# Patient Record
Sex: Male | Born: 1985 | Hispanic: Yes | Marital: Married | Smoking: Never smoker
Health system: Southern US, Community
[De-identification: ages and names within clinical notes are randomized; demographics above are authoritative.]

---

## 2022-04-28 ENCOUNTER — Other Ambulatory Visit: Payer: Self-pay

## 2022-04-28 ENCOUNTER — Encounter (HOSPITAL_COMMUNITY): Payer: Self-pay

## 2022-04-28 ENCOUNTER — Emergency Department (HOSPITAL_COMMUNITY): Payer: Self-pay

## 2022-04-28 ENCOUNTER — Observation Stay (HOSPITAL_COMMUNITY)
Admission: EM | Admit: 2022-04-28 | Discharge: 2022-04-30 | Disposition: A | Discharge status: Home / Self Care | Payer: Self-pay | Attending: Student | Admitting: Student

## 2022-04-28 DIAGNOSIS — S82232A Displaced oblique fracture of shaft of left tibia, initial encounter for closed fracture: Secondary | ICD-10-CM

## 2022-04-28 DIAGNOSIS — S82232G Displaced oblique fracture of shaft of left tibia, subsequent encounter for closed fracture with delayed healing: Principal | ICD-10-CM | POA: Insufficient documentation

## 2022-04-28 DIAGNOSIS — S82202A Unspecified fracture of shaft of left tibia, initial encounter for closed fracture: Secondary | ICD-10-CM | POA: Diagnosis present

## 2022-04-28 DIAGNOSIS — S82432G Displaced oblique fracture of shaft of left fibula, subsequent encounter for closed fracture with delayed healing: Secondary | ICD-10-CM | POA: Insufficient documentation

## 2022-04-28 DIAGNOSIS — W1840XD Slipping, tripping and stumbling without falling, unspecified, subsequent encounter: Secondary | ICD-10-CM | POA: Insufficient documentation

## 2022-04-28 LAB — CBC WITH DIFFERENTIAL/PLATELET
Abs Immature Granulocytes: 0.03 10*3/uL (ref 0.00–0.07)
Basophils Absolute: 0 10*3/uL (ref 0.0–0.1)
Basophils Relative: 0 %
Eosinophils Absolute: 0.1 10*3/uL (ref 0.0–0.5)
Eosinophils Relative: 1 %
HCT: 42.8 % (ref 39.0–52.0)
Hemoglobin: 14.6 g/dL (ref 13.0–17.0)
Immature Granulocytes: 0 %
Lymphocytes Relative: 20 %
Lymphs Abs: 2 10*3/uL (ref 0.7–4.0)
MCH: 29 pg (ref 26.0–34.0)
MCHC: 34.1 g/dL (ref 30.0–36.0)
MCV: 84.9 fL (ref 80.0–100.0)
Monocytes Absolute: 0.8 10*3/uL (ref 0.1–1.0)
Monocytes Relative: 9 %
Neutro Abs: 6.8 10*3/uL (ref 1.7–7.7)
Neutrophils Relative %: 70 %
Platelets: 292 10*3/uL (ref 150–400)
RBC: 5.04 MIL/uL (ref 4.22–5.81)
RDW: 13.3 % (ref 11.5–15.5)
WBC: 9.7 10*3/uL (ref 4.0–10.5)
nRBC: 0 % (ref 0.0–0.2)

## 2022-04-28 LAB — BASIC METABOLIC PANEL
Anion gap: 9 (ref 5–15)
BUN: 14 mg/dL (ref 6–20)
CO2: 23 mmol/L (ref 22–32)
Calcium: 9.1 mg/dL (ref 8.9–10.3)
Chloride: 107 mmol/L (ref 98–111)
Creatinine, Ser: 0.88 mg/dL (ref 0.61–1.24)
GFR, Estimated: >60 mL/min (ref >60)
Glucose, Bld: 116 mg/dL — ABNORMAL HIGH (ref 70–99)
Potassium: 3.4 mmol/L — ABNORMAL LOW (ref 3.5–5.1)
Sodium: 139 mmol/L (ref 135–145)

## 2022-04-28 MED ORDER — METHOCARBAMOL 500 MG PO TABS
500.0000 mg | ORAL_TABLET | Freq: Four times a day (QID) | ORAL | Status: DC | PRN
  Administered 2022-04-29 – 2022-04-30 (×3): 500 mg via ORAL
  Filled 2022-04-28 (×3): qty 1

## 2022-04-28 MED ORDER — POLYETHYLENE GLYCOL 3350 17 G PO PACK: 17.0000 g | PACK | Freq: Every day | ORAL | Status: DC | PRN

## 2022-04-28 MED ORDER — ACETAMINOPHEN 325 MG PO TABS: 325.0000 mg | ORAL_TABLET | Freq: Four times a day (QID) | ORAL | Status: DC | PRN

## 2022-04-28 MED ORDER — ENOXAPARIN SODIUM 40 MG/0.4ML IJ SOSY: 40.0000 mg | PREFILLED_SYRINGE | INTRAMUSCULAR | Status: DC

## 2022-04-28 MED ORDER — ONDANSETRON HCL 4 MG/2ML IJ SOLN
4.0000 mg | Freq: Four times a day (QID) | INTRAMUSCULAR | Status: DC | PRN
  Administered 2022-04-29: 4 mg via INTRAVENOUS
  Filled 2022-04-28: qty 2

## 2022-04-28 MED ORDER — HYDROCODONE-ACETAMINOPHEN 5-325 MG PO TABS
1.0000 | ORAL_TABLET | ORAL | Status: DC | PRN
  Administered 2022-04-29 (×2): 1 via ORAL
  Administered 2022-04-29 – 2022-04-30 (×2): 2 via ORAL
  Filled 2022-04-28 (×3): qty 2
  Filled 2022-04-28: qty 1

## 2022-04-28 MED ORDER — ONDANSETRON HCL 4 MG PO TABS: 4.0000 mg | ORAL_TABLET | Freq: Four times a day (QID) | ORAL | Status: DC | PRN

## 2022-04-28 MED ORDER — METHOCARBAMOL 1000 MG/10ML IJ SOLN: 500.0000 mg | Freq: Four times a day (QID) | INTRAVENOUS | Status: DC | PRN

## 2022-04-28 MED ORDER — MORPHINE SULFATE (PF) 2 MG/ML IV SOLN
0.5000 mg | INTRAVENOUS | Status: DC | PRN
  Administered 2022-04-29 (×2): 1 mg via INTRAVENOUS
  Filled 2022-04-28 (×2): qty 1

## 2022-04-28 NOTE — ED Triage Notes (Signed)
Pt arrived POV c/o a left foot fx. Pt's family said she just drove him down from [place] because he broke his foot and they stated he might need surgery.

## 2022-04-28 NOTE — ED Provider Triage Note (Signed)
Emergency Medicine Provider Triage Evaluation Note  Ross Bennett , a 36 y.o. male  was evaluated in triage.  Pt complains of a positive left lower leg fracture.  This occurred yesterday up in [place].  He was splinted in the emergency department up there and told to come back to his residence.  Patient's significant other is at bedside stated that he needed surgery.  No other injury at this time.  Review of Systems  Positive:  Negative: See above  Physical Exam  BP 133/75   Pulse 75   Temp 97.9 F (36.6 C) (Oral)   Resp 16   SpO2 100%  Gen:   Awake, no distress   Resp:  Normal effort  MSK:   Moves extremities without difficulty  Other:  Left lower extremity is wrapped.  Medical Decision Making  Medically screening exam initiated at 3:44 PM.  Appropriate orders placed.  Ross Bennett was informed that the remainder of the evaluation will be completed by another provider, this initial triage assessment does not replace that evaluation, and the importance of remaining in the ED until their evaluation is complete.     Honor Loh [place], [place] 04/28/22 1545

## 2022-04-28 NOTE — ED Provider Notes (Signed)
[facility] Provider Note   CSN: [id number] Arrival date & time: 04/28/22  1529     History  Chief Complaint  Patient presents with   Foot Pain    Ross Bennett is a 36 y.o. male with no pertinent past medical history.  Presents emergency department with a chief complaint of left lower extremity fracture.  Patient states that yesterday while in [place] he was playing soccer when he slipped and tried to turn.  Patient states that he heard a crack.  Patient has had pain to left lower leg, ankle, and foot since then.  Patient went to emergency department and was diagnosed with a fracture and placed in a soft splint.  Patient reports that he was told to go home and follow-up with provider there.  Patient states he was sent home with pain medication.  Patient reports that he does not have any [place]opedic providers [place] [place].  Patient rates pain 4/10 at present.  States that with movement pain increases to 9/10 on the pain scale.    Patient denies any numbness, weakness, color change, pallor, wound.   Foot Pain      Home Medications Prior to Admission medications   Not on File      Allergies    Patient has no known allergies.    Review of Systems   Review of Systems  Constitutional:  Negative for chills and fever.  Musculoskeletal:  Positive for arthralgias and myalgias.  Skin:  Negative for color change, pallor, rash and wound.  Neurological:  Negative for weakness and numbness.   Physical Exam Updated Vital Signs BP 133/75   Pulse 75   Temp 97.9 F (36.6 C) (Oral)   Resp 16   Ht 5\' 3"  (1.6 m)   Wt 80.7 kg   SpO2 100%   BMI 31.53 kg/m  Physical Exam Vitals and nursing note reviewed.  Constitutional:      General: He is not in acute distress.    Appearance: He is not ill-appearing, toxic-appearing or diaphoretic.  HENT:     Head: Normocephalic.  Eyes:     General: No scleral icterus.       Right  eye: No discharge.        Left eye: No discharge.  Cardiovascular:     Rate and Rhythm: Normal rate.  Pulmonary:     Effort: Pulmonary effort is normal.  Musculoskeletal:     Comments: Soft splint is located to left lower extremity, ankle and foot.  Toes that are visible have sensation intact and cap refill less than 3 seconds.  No erythema noted to toes or left lower extremity.  Skin:    General: Skin is warm and dry.  Neurological:     General: No focal deficit present.     Mental Status: He is alert.  Psychiatric:        Behavior: Behavior is cooperative.    ED Results / Procedures / Treatments   Labs (all labs ordered are listed, but only abnormal results are displayed) Labs Reviewed  BASIC METABOLIC PANEL - Abnormal; Notable for the following components:      Result Value   Potassium 3.4 (*)    Glucose, Bld 116 (*)    All other components within normal limits  CBC WITH DIFFERENTIAL/PLATELET    EKG None  Radiology DG Tibia/Fibula Left  Result Date: 04/28/2022 CLINICAL DATA:  Left lower leg pain. Positive fracture. Injured left lower leg playing  soccer several days ago. Went to ER in [place]. Now increased pain. Patient in splint. EXAM: LEFT TIBIA AND FIBULA - 2 VIEW; LEFT ANKLE COMPLETE - 3+ VIEW; LEFT FOOT - COMPLETE 3+ VIEW COMPARISON:  None Available. FINDINGS: Left tibia and fibula: There is overlying cast material. There is a predominantly oblique superolateral to inferior medial mildly comminuted fracture of the distal tibial diaphysis. There is mild lateral displacement of the distal fracture component, with up to approximately 9 mm cortical step-off at the superolateral aspect of the fracture and 3 mm cortical step-off at the inferior medial aspect of the fracture on frontal view. There is an oblique, mildly comminuted fracture of the proximal fibular diaphysis with up to 5 mm transverse dimension diastasis at the mid aspect and minimal 2 mm superolateral and 3 mm  posteroinferior cortical step-off. Left ankle: The ankle mortise is symmetric and intact. Overlying cast material limits evaluation of fine bony detail. No additional acute fracture is seen. Joint spaces are preserved. Left foot: Joint spaces are preserved.  No acute fracture or dislocation. IMPRESSION: 1. Oblique, mildly displaced distal tibial diaphyseal acute fracture. 2. Oblique, minimally displaced proximal fibular diaphyseal fracture. Electronically Signed   By: Neita Garnet M.D.   On: 04/28/2022 16:29   DG Ankle Complete Left  Result Date: 04/28/2022 CLINICAL DATA:  Left lower leg pain. Positive fracture. Injured left lower leg playing soccer several days ago. Went to ER in [place]. Now increased pain. Patient in splint. EXAM: LEFT TIBIA AND FIBULA - 2 VIEW; LEFT ANKLE COMPLETE - 3+ VIEW; LEFT FOOT - COMPLETE 3+ VIEW COMPARISON:  None Available. FINDINGS: Left tibia and fibula: There is overlying cast material. There is a predominantly oblique superolateral to inferior medial mildly comminuted fracture of the distal tibial diaphysis. There is mild lateral displacement of the distal fracture component, with up to approximately 9 mm cortical step-off at the superolateral aspect of the fracture and 3 mm cortical step-off at the inferior medial aspect of the fracture on frontal view. There is an oblique, mildly comminuted fracture of the proximal fibular diaphysis with up to 5 mm transverse dimension diastasis at the mid aspect and minimal 2 mm superolateral and 3 mm posteroinferior cortical step-off. Left ankle: The ankle mortise is symmetric and intact. Overlying cast material limits evaluation of fine bony detail. No additional acute fracture is seen. Joint spaces are preserved. Left foot: Joint spaces are preserved.  No acute fracture or dislocation. IMPRESSION: 1. Oblique, mildly displaced distal tibial diaphyseal acute fracture. 2. Oblique, minimally displaced proximal fibular diaphyseal fracture.  Electronically Signed   By: Neita Garnet M.D.   On: 04/28/2022 16:29   DG Foot Complete Left  Result Date: 04/28/2022 CLINICAL DATA:  Left lower leg pain. Positive fracture. Injured left lower leg playing soccer several days ago. Went to ER in [place]. Now increased pain. Patient in splint. EXAM: LEFT TIBIA AND FIBULA - 2 VIEW; LEFT ANKLE COMPLETE - 3+ VIEW; LEFT FOOT - COMPLETE 3+ VIEW COMPARISON:  None Available. FINDINGS: Left tibia and fibula: There is overlying cast material. There is a predominantly oblique superolateral to inferior medial mildly comminuted fracture of the distal tibial diaphysis. There is mild lateral displacement of the distal fracture component, with up to approximately 9 mm cortical step-off at the superolateral aspect of the fracture and 3 mm cortical step-off at the inferior medial aspect of the fracture on frontal view. There is an oblique, mildly comminuted fracture of the proximal fibular diaphysis with up to  5 mm transverse dimension diastasis at the mid aspect and minimal 2 mm superolateral and 3 mm posteroinferior cortical step-off. Left ankle: The ankle mortise is symmetric and intact. Overlying cast material limits evaluation of fine bony detail. No additional acute fracture is seen. Joint spaces are preserved. Left foot: Joint spaces are preserved.  No acute fracture or dislocation. IMPRESSION: 1. Oblique, mildly displaced distal tibial diaphyseal acute fracture. 2. Oblique, minimally displaced proximal fibular diaphyseal fracture. Electronically Signed   By: Neita Garnet M.D.   On: 04/28/2022 16:29    Procedures Procedures    Medications Ordered in ED Medications - No data to display  ED Course/ Medical Decision Making/ A&P Clinical Course as of 04/28/22 2023  Thu Apr 28, 2022  1954 I spoke to Dr.Marchwiany who will review patient's images and give recommendations. [PB]  2000 Plan is for patient to be admitted to orthopedic team, n.p.o. at midnight, plan  for surgery tomorrow. [PB]    Clinical Course User Index [PB] Haskel Schroeder, PA-C                           Medical Decision Making Risk Decision regarding hospitalization.   Alert 36 year old male no acute distress, nontoxic-appearing.  Presents to the ED with a chief complaint of left lower leg fracture.  Information obtained from patient and patient's wife at bedside.  Spanish interpreter was used to conduct this interview.  Past medical records were attempted to be reviewed however none present.  Ultrasound imaging was obtained while patient was in triage.  I personally viewed interpret patient's imaging.  Agree with radiology interpretation of: -No acute osseous abnormality to left foot, or left ankle. -Oblique mildly displaced distal tibia fracture, oblique minimally displaced proximal fibular fracture.  We will consult on-call orthopedic provider for the patient's fractures.  Dr.Marchwiany advised that patient will need admission for surgical repair tomorrow.  Dr.Marchwiany will see the patient for admission.          Final Clinical Impression(s) / ED Diagnoses Final diagnoses:  Closed displaced oblique fracture of shaft of left fibula with delayed healing, subsequent encounter  Closed displaced oblique fracture of shaft of left tibia with delayed healing, subsequent encounter    Rx / DC Orders ED Discharge Orders     None         Berneice Heinrich 04/28/22 2024    Pricilla Loveless, MD 04/30/22 [phone]

## 2022-04-28 NOTE — Progress Notes (Signed)
Patient with left closed tibia fracture soccer injury sustained in [place]. Seen in ER there, placed in splint and discharged. Indicated for ORIF with tibial nail. Plan for surgery tomorrow. Full H&P note to follow.

## 2022-04-28 NOTE — H&P (Signed)
ORTHOPAEDIC H&P  REQUESTING PHYSICIAN: Joen Laura, MD  Chief Complaint: left tibia fracture  HPI: Ross Bennett is a 36 y.o. male who is otherwise healthy, was playing soccer yesterday in [place], when he twisted his leg and felt his leg give and was unable to weight-bear.  He was seen in emergency room in [place] where x-rays demonstrated a fracture he was immobilized in a splint.  Given that the patient lives in [place] he elected to be discharged and drive home to [place] to receive further care here.  He presented to the emergency room this evening for repeat x-rays confirmed a displaced tibial shaft fracture.  He denies prior history of injury or trauma to the leg.  He denies pain in the joints or extremities.  He denies distal numbness and tingling.  He works in [employer].  Social History   Socioeconomic History   Marital status: Married    Spouse name: Not on file   Number of children: Not on file   Years of education: Not on file   Highest education level: Not on file  Occupational History   Not on file  Tobacco Use   Smoking status: Not on file   Smokeless tobacco: Not on file  Substance and Sexual Activity   Alcohol use: Not on file   Drug use: Not on file   Sexual activity: Not on file  Other Topics Concern   Not on file  Social History Narrative   Not on file   Social Determinants of Health   Financial Resource Strain: Not on file  Food Insecurity: Not on file  Transportation Needs: Not on file  Physical Activity: Not on file  Stress: Not on file  Social Connections: Not on file   History reviewed. No pertinent family history. No Known Allergies   Positive ROS: All other systems have been reviewed and were otherwise negative with the exception of those mentioned in the HPI and as above.  Physical Exam: General: Alert, no acute distress Cardiovascular: No pedal edema Respiratory: No cyanosis, no use of accessory  musculature Skin: No lesions in the area of chief complaint Neurologic: Sensation intact distally Psychiatric: Patient is competent for consent with normal mood and affect  MUSCULOSKELETAL:  LLE No traumatic wounds, ecchymosis, or rash  Nontender about the knee  Lower leg compartments are soft and compressible  No groin pain with log roll  No knee effusion  Sens DPN, SPN, TN intact  Motor EHL, FHL 5/5  DP 2+, PT 2+, No significant edema   IMAGING: X-rays left tibia were reviewed demonstrate proximal fibula fracture and displaced tibial shaft fracture  Assessment: Principal Problem:   Fracture of tibial shaft, left, closed  Closed left tibial shaft fracture  Plan: Patient seen and evaluated by myself in the emergency room with a video Spanish interpreter.   x-rays are consistent with a displaced tibial shaft fracture.  Splint was cut down and skin and compartments were evaluated and the splint was then rewrapped.  Compartments are soft and compressible.  Neurovascular intact distally.  Skin is intact. Nonweightbearing left lower extremity in splint. The risks benefits and alternatives were discussed with the patient including but not limited to the risks of nonoperative treatment, versus surgical intervention were discussed plan for open reduction internal fixation in the morning.  Patient to be n.p.o. after midnight.    Joen Laura, MD  Contact information:   IRCVELFY 7am-5pm epic message Dr. Blanchie Dessert, or call office for patient follow  up: [phone] After hours and holidays please check [email or web address] for group call information for [facility]

## 2022-04-29 ENCOUNTER — Other Ambulatory Visit: Payer: Self-pay

## 2022-04-29 ENCOUNTER — Encounter (HOSPITAL_COMMUNITY)
Admission: EM | Disposition: A | Discharge status: Home / Self Care | Payer: Self-pay | Source: Home / Self Care | Attending: Emergency Medicine

## 2022-04-29 ENCOUNTER — Encounter (HOSPITAL_COMMUNITY): Payer: Self-pay | Admitting: Orthopedic Surgery

## 2022-04-29 ENCOUNTER — Inpatient Hospital Stay (HOSPITAL_COMMUNITY): Payer: Self-pay

## 2022-04-29 ENCOUNTER — Inpatient Hospital Stay (HOSPITAL_COMMUNITY): Payer: Self-pay | Admitting: Registered Nurse

## 2022-04-29 DIAGNOSIS — S82202A Unspecified fracture of shaft of left tibia, initial encounter for closed fracture: Secondary | ICD-10-CM

## 2022-04-29 HISTORY — PX: TIBIA IM NAIL INSERTION: SHX2516

## 2022-04-29 SURGERY — INSERTION, INTRAMEDULLARY ROD, TIBIA
Anesthesia: General | Site: Leg Lower | Laterality: Left

## 2022-04-29 MED ORDER — METHOCARBAMOL 500 MG PO TABS: 500.0000 mg | ORAL_TABLET | Freq: Four times a day (QID) | ORAL | 0 refills | Status: AC | PRN

## 2022-04-29 MED ORDER — PROPOFOL 10 MG/ML IV BOLUS
INTRAVENOUS | Status: DC | PRN
  Administered 2022-04-29: 150 mg via INTRAVENOUS

## 2022-04-29 MED ORDER — SUGAMMADEX SODIUM 200 MG/2ML IV SOLN
INTRAVENOUS | Status: DC | PRN
  Administered 2022-04-29 (×2): 100 mg via INTRAVENOUS

## 2022-04-29 MED ORDER — FENTANYL CITRATE (PF) 250 MCG/5ML IJ SOLN
INTRAMUSCULAR | Status: AC
  Filled 2022-04-29: qty 5

## 2022-04-29 MED ORDER — DEXAMETHASONE SODIUM PHOSPHATE 10 MG/ML IJ SOLN
INTRAMUSCULAR | Status: DC | PRN
  Administered 2022-04-29: 5 mg via INTRAVENOUS

## 2022-04-29 MED ORDER — ROCURONIUM BROMIDE 10 MG/ML (PF) SYRINGE
PREFILLED_SYRINGE | INTRAVENOUS | Status: DC | PRN
  Administered 2022-04-29: 50 mg via INTRAVENOUS

## 2022-04-29 MED ORDER — ROCURONIUM BROMIDE 10 MG/ML (PF) SYRINGE
PREFILLED_SYRINGE | INTRAVENOUS | Status: AC
  Filled 2022-04-29: qty 10

## 2022-04-29 MED ORDER — LACTATED RINGERS IV SOLN: INTRAVENOUS | Status: DC | PRN

## 2022-04-29 MED ORDER — MIDAZOLAM HCL 2 MG/2ML IJ SOLN
INTRAMUSCULAR | Status: AC
  Filled 2022-04-29: qty 2

## 2022-04-29 MED ORDER — POVIDONE-IODINE 10 % EX SWAB: 2.0000 "application " | Freq: Once | CUTANEOUS | Status: AC

## 2022-04-29 MED ORDER — CEFAZOLIN SODIUM-DEXTROSE 2-4 GM/100ML-% IV SOLN
2.0000 g | Freq: Three times a day (TID) | INTRAVENOUS | Status: AC
  Administered 2022-04-29 (×3): 2 g via INTRAVENOUS
  Filled 2022-04-29 (×2): qty 100

## 2022-04-29 MED ORDER — LIDOCAINE 2% (20 MG/ML) 5 ML SYRINGE
INTRAMUSCULAR | Status: DC | PRN
  Administered 2022-04-29: 80 mg via INTRAVENOUS

## 2022-04-29 MED ORDER — BUPIVACAINE-EPINEPHRINE (PF) 0.5% -1:200000 IJ SOLN
INTRAMUSCULAR | Status: DC | PRN
Start: 2022-04-29 — End: 2022-04-29
  Administered 2022-04-29: 10 mL

## 2022-04-29 MED ORDER — ONDANSETRON HCL 4 MG/2ML IJ SOLN
INTRAMUSCULAR | Status: DC | PRN
  Administered 2022-04-29: 4 mg via INTRAVENOUS

## 2022-04-29 MED ORDER — FENTANYL CITRATE (PF) 100 MCG/2ML IJ SOLN
INTRAMUSCULAR | Status: AC
  Filled 2022-04-29: qty 2

## 2022-04-29 MED ORDER — FENTANYL CITRATE (PF) 250 MCG/5ML IJ SOLN
INTRAMUSCULAR | Status: DC | PRN
Start: 2022-04-29 — End: 2022-04-29
  Administered 2022-04-29 (×3): 50 ug via INTRAVENOUS

## 2022-04-29 MED ORDER — LIDOCAINE 2% (20 MG/ML) 5 ML SYRINGE
INTRAMUSCULAR | Status: AC
  Filled 2022-04-29: qty 5

## 2022-04-29 MED ORDER — DEXAMETHASONE SODIUM PHOSPHATE 10 MG/ML IJ SOLN
INTRAMUSCULAR | Status: AC
  Filled 2022-04-29: qty 1

## 2022-04-29 MED ORDER — CHLORHEXIDINE GLUCONATE 4 % EX LIQD: 60.0000 mL | Freq: Once | CUTANEOUS | Status: AC

## 2022-04-29 MED ORDER — FENTANYL CITRATE (PF) 100 MCG/2ML IJ SOLN
25.0000 ug | INTRAMUSCULAR | Status: DC | PRN
  Administered 2022-04-29 (×3): 50 ug via INTRAVENOUS

## 2022-04-29 MED ORDER — CEFAZOLIN SODIUM-DEXTROSE 2-4 GM/100ML-% IV SOLN
INTRAVENOUS | Status: AC
  Filled 2022-04-29: qty 100

## 2022-04-29 MED ORDER — STERILE WATER FOR IRRIGATION IR SOLN
Status: DC | PRN
  Administered 2022-04-29: 1000 mL

## 2022-04-29 MED ORDER — ASPIRIN 81 MG PO TBEC: 81.0000 mg | DELAYED_RELEASE_TABLET | Freq: Every day | ORAL | 0 refills | Status: AC

## 2022-04-29 MED ORDER — ONDANSETRON HCL 4 MG/2ML IJ SOLN
INTRAMUSCULAR | Status: AC
  Filled 2022-04-29: qty 2

## 2022-04-29 MED ORDER — OXYCODONE-ACETAMINOPHEN 5-325 MG PO TABS
1.0000 | ORAL_TABLET | ORAL | 0 refills | Status: AC | PRN
Start: 2022-04-29 — End: 2022-05-06

## 2022-04-29 MED ORDER — MIDAZOLAM HCL 2 MG/2ML IJ SOLN
INTRAMUSCULAR | Status: DC | PRN
  Administered 2022-04-29: 2 mg via INTRAVENOUS

## 2022-04-29 MED ORDER — BUPIVACAINE-EPINEPHRINE 0.5% -1:200000 IJ SOLN
INTRAMUSCULAR | Status: AC
  Filled 2022-04-29: qty 1

## 2022-04-29 MED ORDER — ACETAMINOPHEN 10 MG/ML IV SOLN: 1000.0000 mg | Freq: Once | INTRAVENOUS | Status: DC | PRN

## 2022-04-29 MED ORDER — CEFAZOLIN SODIUM-DEXTROSE 2-4 GM/100ML-% IV SOLN
2.0000 g | INTRAVENOUS | Status: AC
  Administered 2022-04-30: 2 g via INTRAVENOUS
  Filled 2022-04-29: qty 100

## 2022-04-29 MED ORDER — VANCOMYCIN HCL 1000 MG IV SOLR
INTRAVENOUS | Status: AC
  Filled 2022-04-29: qty 20

## 2022-04-29 MED ORDER — HYDROMORPHONE HCL 1 MG/ML IJ SOLN
INTRAMUSCULAR | Status: AC
  Filled 2022-04-29: qty 1

## 2022-04-29 MED ORDER — VANCOMYCIN HCL 1000 MG IV SOLR
INTRAVENOUS | Status: DC | PRN
  Administered 2022-04-29: 1000 mg

## 2022-04-29 MED ORDER — PROPOFOL 10 MG/ML IV BOLUS
INTRAVENOUS | Status: AC
  Filled 2022-04-29: qty 20

## 2022-04-29 MED ORDER — HYDROMORPHONE HCL 1 MG/ML IJ SOLN
0.2500 mg | INTRAMUSCULAR | Status: DC | PRN
  Administered 2022-04-29 (×2): 0.5 mg via INTRAVENOUS

## 2022-04-29 MED ORDER — 0.9 % SODIUM CHLORIDE (POUR BTL) OPTIME
TOPICAL | Status: DC | PRN
  Administered 2022-04-29: 1000 mL

## 2022-04-29 MED ORDER — ASPIRIN 325 MG PO TABS: 325.0000 mg | ORAL_TABLET | Freq: Every day | ORAL | Status: DC

## 2022-04-29 SURGICAL SUPPLY — 60 items
BAG COUNTER SPONGE SURGICOUNT (BAG) ×1 IMPLANT
BENZOIN TINCTURE PRP APPL 2/3 (GAUZE/BANDAGES/DRESSINGS) ×1 IMPLANT
BIT DRILL FLEXIBLE LONG 12 (BIT) ×1 IMPLANT
BIT DRILL LONG 4.2 (BIT) ×1 IMPLANT
BIT DRILL SHORT 4.2 (BIT) IMPLANT
BLADE SURG 10 STRL SS (BLADE) ×2 IMPLANT
BNDG COHESIVE 4X5 TAN STRL (GAUZE/BANDAGES/DRESSINGS) ×2 IMPLANT
BNDG ELASTIC 4X5.8 VLCR STR LF (GAUZE/BANDAGES/DRESSINGS) ×2 IMPLANT
BNDG ELASTIC 6X5.8 VLCR STR LF (GAUZE/BANDAGES/DRESSINGS) ×2 IMPLANT
BNDG GAUZE ELAST 4 BULKY (GAUZE/BANDAGES/DRESSINGS) ×1 IMPLANT
BRUSH SCRUB EZ PLAIN DRY (MISCELLANEOUS) ×3 IMPLANT
CHLORAPREP W/TINT 26 (MISCELLANEOUS) ×3 IMPLANT
COVER SURGICAL LIGHT HANDLE (MISCELLANEOUS) ×3 IMPLANT
DRAPE C-ARM 42X72 X-RAY (DRAPES) ×2 IMPLANT
DRAPE C-ARMOR (DRAPES) ×2 IMPLANT
DRAPE HALF SHEET 40X57 (DRAPES) ×2 IMPLANT
DRAPE IMP U-DRAPE 54X76 (DRAPES) ×3 IMPLANT
DRAPE INCISE IOBAN 66X45 STRL (DRAPES) IMPLANT
DRAPE ORTHO SPLIT 77X108 STRL (DRAPES) ×4
DRAPE SURG ORHT 6 SPLT 77X108 (DRAPES) ×2 IMPLANT
DRAPE U-SHAPE 47X51 STRL (DRAPES) ×2 IMPLANT
DRILL BIT SHORT 4.2 (BIT) ×4
DRSG ADAPTIC 3X8 NADH LF (GAUZE/BANDAGES/DRESSINGS) ×1 IMPLANT
ELECT REM PT RETURN 9FT ADLT (ELECTROSURGICAL) ×2
ELECTRODE REM PT RTRN 9FT ADLT (ELECTROSURGICAL) ×1 IMPLANT
GAUZE SPONGE 4X4 12PLY STRL (GAUZE/BANDAGES/DRESSINGS) ×1 IMPLANT
GLOVE BIO SURGEON STRL SZ 6.5 (GLOVE) ×6 IMPLANT
GLOVE BIO SURGEON STRL SZ7.5 (GLOVE) ×7 IMPLANT
GLOVE BIOGEL PI IND STRL 6.5 (GLOVE) ×1 IMPLANT
GLOVE BIOGEL PI IND STRL 7.5 (GLOVE) ×1 IMPLANT
GLOVE BIOGEL PI INDICATOR 6.5 (GLOVE) ×2
GLOVE BIOGEL PI INDICATOR 7.5 (GLOVE) ×2
GOWN STRL REUS W/ TWL LRG LVL3 (GOWN DISPOSABLE) ×2 IMPLANT
GOWN STRL REUS W/TWL LRG LVL3 (GOWN DISPOSABLE) ×4
GUIDEWIRE 3.2X400 (WIRE) ×1 IMPLANT
KIT BASIN OR (CUSTOM PROCEDURE TRAY) ×2 IMPLANT
KIT TURNOVER KIT B (KITS) ×2 IMPLANT
NAIL TIB STRL 10X315 (Nail) ×1 IMPLANT
NEEDLE 22X1 1/2 (OR ONLY) (NEEDLE) ×1 IMPLANT
PACK TOTAL JOINT (CUSTOM PROCEDURE TRAY) ×2 IMPLANT
PAD ARMBOARD 7.5X6 YLW CONV (MISCELLANEOUS) ×4 IMPLANT
PADDING CAST ABS 6INX4YD NS (CAST SUPPLIES) ×1
PADDING CAST ABS COTTON 6X4 NS (CAST SUPPLIES) IMPLANT
PADDING CAST COTTON 6X4 STRL (CAST SUPPLIES) ×1 IMPLANT
REAMER ROD 3.8 BALL TIP 3X950 (ORTHOPEDIC DISPOSABLE SUPPLIES) ×1 IMPLANT
SCREW LOCK IM 68X5XLOPRFL NS (Screw) IMPLANT
SCREW LOCK IM NAIL 5X68 (Screw) ×2 IMPLANT
SCREW LOCK LP 5.36 (Screw) ×2 IMPLANT
SCREW LOCK LP 5X34 (Screw) ×1 IMPLANT
STAPLER VISISTAT 35W (STAPLE) ×1 IMPLANT
STRIP CLOSURE SKIN 1/2X4 (GAUZE/BANDAGES/DRESSINGS) ×1 IMPLANT
SUT MNCRL AB 3-0 PS2 18 (SUTURE) ×3 IMPLANT
SUT VIC AB 0 CT1 27 (SUTURE) ×2
SUT VIC AB 0 CT1 27XBRD ANBCTR (SUTURE) IMPLANT
SUT VIC AB 2-0 CT1 27 (SUTURE) ×2
SUT VIC AB 2-0 CT1 TAPERPNT 27 (SUTURE) IMPLANT
SYR CONTROL 10ML LL (SYRINGE) ×1 IMPLANT
TOWEL GREEN STERILE (TOWEL DISPOSABLE) ×3 IMPLANT
TOWEL GREEN STERILE FF (TOWEL DISPOSABLE) ×2 IMPLANT
YANKAUER SUCT BULB TIP NO VENT (SUCTIONS) IMPLANT

## 2022-04-29 NOTE — Anesthesia Postprocedure Evaluation (Signed)
Anesthesia Post Note  Patient: Rami Budhu  Procedure(s) Performed: INTRAMEDULLARY (IM) NAIL TIBIAL (Left: Leg Lower)     Patient location during evaluation: PACU Anesthesia Type: General Level of consciousness: awake and alert Pain management: pain level controlled Vital Signs Assessment: post-procedure vital signs reviewed and stable Respiratory status: spontaneous breathing, nonlabored ventilation, respiratory function stable and patient connected to nasal cannula oxygen Cardiovascular status: blood pressure returned to baseline and stable Postop Assessment: no apparent nausea or vomiting Anesthetic complications: no   No notable events documented.  Last Vitals:  Vitals:   04/29/22 1236 04/29/22 1258  BP: 133/78 133/82  Pulse: 84 69  Resp: 17 16  Temp: 36.7 C 36.8 C  SpO2: 100% 95%    Last Pain:  Vitals:   04/29/22 1258  TempSrc: Oral  PainSc:                  Nelle Don Brycelyn Gambino

## 2022-04-29 NOTE — Interval H&P Note (Signed)
History and Physical Interval Note:  04/29/2022 7:14 AM  Burman Nieves  has presented today for surgery, with the diagnosis of Left Tibia fracture.  The various methods of treatment have been discussed with the patient and family. After consideration of risks, benefits and other options for treatment, the patient has consented to  Procedure(s): INTRAMEDULLARY (IM) NAIL TIBIAL (Left) as a surgical intervention.  The patient's history has been reviewed, patient examined, no change in status, stable for surgery.  I have reviewed the patient's chart and labs.  Questions were answered to the patient's satisfaction.     Caryn Bee P Apostolos Blagg

## 2022-04-29 NOTE — Progress Notes (Signed)
Orthopedic Tech Progress Note Patient Details:  Ross Bennett 09-21-1986 ED:[id number]  Ortho Devices Type of Ortho Device: CAM walker Ortho Device/Splint Interventions: Ordered      [name] [name] 04/29/2022, 8:47 AM

## 2022-04-29 NOTE — H&P (View-Only) (Signed)
Orthopaedic Trauma Service (OTS) Consult   Patient ID: Ross Bennett MRN: [id number] DOB/AGE: July 13, 1986 36 y.o.  Reason for Consult:Left tibia fracture Referring Physician: Dr. Ardith Dark, MD [facility]  HPI: Ross Bennett is an 36 y.o. male who is being seen in consultation at the request of Dr. Blanchie Dessert.  Patient was playing soccer and sustained a twisting injury and a left tibial shaft fracture.  This occurred in [place] and he was splinted and sent here.  Presents emergency room last night.  Due to OR availability Dr. Blanchie Dessert requested assistance by orthopedic trauma.  Patient was seen and evaluated in preoperative holding area.  Currently comfortable.  Denies any significant pain.  Lives at home with wife.  Has 2 steps to enter his house.  The works [employer].  Denies any tobacco use.  History reviewed. No pertinent past medical history.  History reviewed. No pertinent family history.  Social History:  has no history on file for tobacco use, alcohol use, and drug use.  Allergies: No Known Allergies  Medications:  No current facility-administered medications on file prior to encounter.   Current Outpatient Medications on File Prior to Encounter  Medication Sig Dispense Refill   acetaminophen (TYLENOL) 325 MG tablet Take 650 mg by mouth every 6 (six) hours as needed for moderate pain.     oxyCODONE-acetaminophen (PERCOCET) 7.5-325 MG tablet Take 1 tablet by mouth every 6 (six) hours as needed for severe pain.       ROS: Constitutional: No fever or chills Vision: No changes in vision ENT: No difficulty swallowing CV: No chest pain Pulm: No SOB or wheezing GI: No nausea or vomiting GU: No urgency or inability to hold urine Skin: No poor wound healing Neurologic: No numbness or tingling Psychiatric: No depression or anxiety Heme: No bruising Allergic: No reaction to medications or food   Exam: Blood pressure 113/65, pulse (!) 59,  temperature 97.9 F (36.6 C), temperature source Oral, resp. rate 18, height 5\' 3"  (1.6 m), weight 80.7 kg, SpO2 98 %. General: No acute distress Orientation: Awake alert and oriented x3 Mood and Affect: Cooperative and pleasant Gait: Unable to assess due to his fracture Coordination and balance: Within normal limits   Left lower extremity: Skin without lesions.  Splint is in place it is clean dry and intact.  Compartments are soft compressible.  He has active dorsiflexion plantarflexion of his toes.  He has sensation intact to light touch to his toes.  He has brisk cap refill less than 2 seconds.  No deformity about the knee or the thigh.  Right lower extremity skin without lesions. No tenderness to palpation. Full painless ROM, full strength in each muscle groups without evidence of instability.   Medical Decision Making: Data: Imaging: X-rays show a spiral oblique distal tibial shaft fracture.  Does not appear to have any intra-articular extension.  No significant involvement of the fibula.  Labs:  Results for orders placed or performed during the hospital encounter of 04/28/22 (from the past 24 hour(s))  CBC with Differential     Status: None   Collection Time: 04/28/22  3:50 PM  Result Value Ref Range   WBC 9.7 4.0 - 10.5 K/uL   RBC 5.04 4.22 - 5.81 MIL/uL   Hemoglobin 14.6 13.0 - 17.0 g/dL   HCT 04/30/22 16.6 - 06.3 %   MCV 84.9 80.0 - 100.0 fL   MCH 29.0 26.0 - 34.0 pg   MCHC 34.1 30.0 - 36.0 g/dL  RDW 13.3 11.5 - 15.5 %   Platelets 292 150 - 400 K/uL   nRBC 0.0 0.0 - 0.2 %   Neutrophils Relative % 70 %   Neutro Abs 6.8 1.7 - 7.7 K/uL   Lymphocytes Relative 20 %   Lymphs Abs 2.0 0.7 - 4.0 K/uL   Monocytes Relative 9 %   Monocytes Absolute 0.8 0.1 - 1.0 K/uL   Eosinophils Relative 1 %   Eosinophils Absolute 0.1 0.0 - 0.5 K/uL   Basophils Relative 0 %   Basophils Absolute 0.0 0.0 - 0.1 K/uL   Immature Granulocytes 0 %   Abs Immature Granulocytes 0.03 0.00 - 0.07 K/uL   Basic metabolic panel     Status: Abnormal   Collection Time: 04/28/22  3:50 PM  Result Value Ref Range   Sodium 139 135 - 145 mmol/L   Potassium 3.4 (L) 3.5 - 5.1 mmol/L   Chloride 107 98 - 111 mmol/L   CO2 23 22 - 32 mmol/L   Glucose, Bld 116 (H) 70 - 99 mg/dL   BUN 14 6 - 20 mg/dL   Creatinine, Ser 0.60 0.61 - 1.24 mg/dL   Calcium 9.1 8.9 - 04.5 mg/dL   GFR, Estimated >99 >77 mL/min   Anion gap 9 5 - 15     Imaging or Labs ordered: None  Medical history and chart was reviewed and case discussed with medical provider.  Assessment/Plan: 36 year old male with a left tibial shaft fracture.  Due to the unstable nature of his injury I recommended proceeding with intramedullary nailing.  Risks and benefits were discussed with the patient.  Risks include but not limited to bleeding, infection, malunion, nonunion hardware failure, hardware irritation, nerve or blood vessel injury, DVT, even the possibility anesthetic complications.  He agreed to proceed with surgery and consent was obtained.  There is potential that we could discharge him home postoperatively.  Ross Lofts, MD [facility] [phone] (office) [email or web address]

## 2022-04-29 NOTE — Anesthesia Preprocedure Evaluation (Signed)
Anesthesia Evaluation  Patient identified by MRN, date of birth, ID band Patient awake    Reviewed: Allergy & Precautions, NPO status , Patient's Chart, lab work & pertinent test results  Airway Mallampati: II  TM Distance: >3 FB Neck ROM: Full    Dental  (+) Upper Dentures   Pulmonary neg pulmonary ROS,    Pulmonary exam normal        Cardiovascular negative cardio ROS   Rhythm:Regular Rate:Normal     Neuro/Psych negative neurological ROS  negative psych ROS   GI/Hepatic negative GI ROS, Neg liver ROS,   Endo/Other  negative endocrine ROS  Renal/GU negative Renal ROS  negative genitourinary   Musculoskeletal Tibia fracture   Abdominal Normal abdominal exam  (+)   Peds  Hematology negative hematology ROS (+)   Anesthesia Other Findings   Reproductive/Obstetrics                             Anesthesia Physical Anesthesia Plan  ASA: 2  Anesthesia Plan: General   Post-op Pain Management:    Induction: Intravenous  PONV Risk Score and Plan: 2 and Ondansetron, Dexamethasone, Midazolam and Treatment may vary due to age or medical condition  Airway Management Planned: Mask and Oral ETT  Additional Equipment: None  Intra-op Plan:   Post-operative Plan: Extubation in OR  Informed Consent: I have reviewed the patients History and Physical, chart, labs and discussed the procedure including the risks, benefits and alternatives for the proposed anesthesia with the patient or authorized representative who has indicated his/her understanding and acceptance.     Dental advisory given and Interpreter used for interveiw  Plan Discussed with: CRNA  Anesthesia Plan Comments:         Anesthesia Quick Evaluation

## 2022-04-29 NOTE — Progress Notes (Signed)
Mr. Ross Bennett has some bright red blood on his ACE wrap at the knee level and behind the knee.  I called the oncall PA - Chriss Czar.  He recommended we take down the ACE wrap on his post op knee to see if there is active heavy bleeding, then apply a pressure dressing and ACE wrap.   After removing ACE wrap, there was steri strips present on the knee and some on the post op dressing.  There was a small area on the anterior surface of the knee that had some scant active bleeding.   I applied x6 sterile 4x4s, Kerlix, and ACE wrap. Elevated the leg on a few pillows and applied ice packs.   Patient rates his pain 7/10, does not want IV morphine. Is ok with his PO pain regimen.

## 2022-04-29 NOTE — Anesthesia Procedure Notes (Signed)
Procedure Name: Intubation Date/Time: 04/29/2022 7:46 AM Performed by: Trinna Post., CRNA Pre-anesthesia Checklist: Patient identified, Emergency Drugs available, Suction available, Patient being monitored and Timeout performed Patient Re-evaluated:Patient Re-evaluated prior to induction Oxygen Delivery Method: Circle system utilized Preoxygenation: Pre-oxygenation with 100% oxygen Induction Type: IV induction Ventilation: Mask ventilation without difficulty Laryngoscope Size: Mac and 4 Grade View: Grade I Tube type: Oral Tube size: 7.5 mm Number of attempts: 1 Airway Equipment and Method: Stylet Placement Confirmation: ETT inserted through vocal cords under direct vision, positive ETCO2 and breath sounds checked- equal and bilateral Secured at: 22 cm Tube secured with: Tape Dental Injury: Teeth and Oropharynx as per pre-operative assessment

## 2022-04-29 NOTE — Op Note (Signed)
Orthopaedic Surgery Operative Note (CSN: [id number] ) Date of Surgery: 04/29/2022  Admit Date: 04/28/2022   Diagnoses: Pre-Op Diagnoses: Left tibial shaft fracture  Post-Op Diagnosis: Same  Procedures: CPT 27759-Intramedullary nailing of left tibial shaft fracture  Surgeons : Primary: Raena Pau, Gillie Manners, MD  Assistant: Darron Doom, RNFA  Location: OR 3   Anesthesia: General   Antibiotics: Ancef 2g preop with 1gm vancomycin powder placed topically   Tourniquet time: None    Estimated Blood Loss: 30 mL  Complications:None   Specimens:None   Implants: Implant Name Type Inv. Item Serial No. Manufacturer Lot No. LRB No. Used Action  tibial nail-advanced   [id number] S DEPUY SYNTHES [id number] Left 1 Implanted  SCREW LOCK LP 5X34 - [id number] Screw SCREW LOCK LP 5X34  [employer]PU[employer]TH[employer]  Left 1 Implanted  SCREW LOCK LP 5.36 - [id number] Screw SCREW LOCK LP 5.36  DEPUY ORTHOPAED[employer]  Left 2 Implanted  SCREW LOCK IM NAIL 5X68 - [id number] Screw SCREW LOCK IM NAIL 5X68  DEPUY ORTHOPA[employer]  Left 1 Implanted     Indications for Surgery: 36 year old male who injured his leg while playing soccer.  He sustained a left tibial shaft fracture.  Due to the unstable nature of his injury I recommend proceeding with intramedullary nailing of his left tibia.  Risks and benefits were discussed with the patient.  Risks include but not limited to bleeding, infection, malunion, nonunion, hardware failure, hardware irritation, nerve or blood vessel injury, DVT, even the possibility anesthetic complications.  He agreed to proceed with surgery and consent was obtained.  Operative Findings: Intramedullary nailing of left shaft fracture using Synthes 10 x TNA tibial nail  Procedure: The patient was identified in the preoperative holding area. Consent was confirmed with the patient and their family and all questions were answered. The operative extremity was marked after confirmation with the patient. he was  then brought back to the operating room by our anesthesia colleagues.  He was carefully transferred over to radiolucent flat top table.  He was placed under general anesthetic.  The left lower extremity was prepped and draped in usual sterile fashion.  Timeout was performed to verify the patient, the procedure, and the extremity.  Preoperative antibiotics were dosed.  Fluoroscopic imaging showed the unstable nature of his injury.  A lateral parapatellar incision was then made and carried down through skin and subcutaneous tissue.  I mobilized the patella medially by releasing a portion of the lateral retinaculum.  I then directed a threaded guidewire at the appropriate starting point on AP and lateral fluoroscopic imaging.  I advanced into the proximal metaphysis.  I used an entry reamer to enter the medullary canal.  I then passed a ball-tipped guidewire down the center canal into the distal metaphysis.  I then measured the length and chose to use a 350 mm nail.  I then sequentially reamed from 8 mm to 11 mm and decided to place a 10 mm nail.  The nail was passed using the targeting arm.  Until it was seated appropriately into the tibia.  I then used perfect circle technique to place 2 medial to lateral distal interlocking screws.  I then used the targeting arm to place 2 proximal interlocking screws.  The targeting arm was removed.  Final fluoroscopic imaging was obtained.  The incision was copiously irrigated.  A gram of vancomycin powder was placed into the incision.  Layered closure of 0 Vicryl, 2-0 Vicryl 3-0 Monocryl and Steri-Strips were used to close the skin.  Local anesthetic was used to anesthetize the surgical incisions.  Sterile dressing was applied and the patient was placed in a walking boot.  Patient was then awoken from anesthesia and taken to the PACU in stable condition.  Post Op Plan/Instructions: Patient will be weightbearing as tolerated to the left lower extremity.  Potential discharge  later today.  Aspirin for DVT prophylaxis.  I was present and performed the entire surgery.  Truitt Merle, MD [facility]

## 2022-04-29 NOTE — Consult Note (Signed)
Orthopaedic Trauma Service (OTS) Consult   Patient ID: Ross Bennett MRN: [id number] DOB/AGE: July 13, 1986 36 y.o.  Reason for Consult:Left tibia fracture Referring Physician: Dr. Ardith Dark, MD [facility]  HPI: Ross Bennett is an 36 y.o. male who is being seen in consultation at the request of Dr. Blanchie Dessert.  Patient was playing soccer and sustained a twisting injury and a left tibial shaft fracture.  This occurred in [place] and he was splinted and sent here.  Presents emergency room last night.  Due to OR availability Dr. Blanchie Dessert requested assistance by orthopedic trauma.  Patient was seen and evaluated in preoperative holding area.  Currently comfortable.  Denies any significant pain.  Lives at home with wife.  Has 2 steps to enter his house.  The works [employer].  Denies any tobacco use.  History reviewed. No pertinent past medical history.  History reviewed. No pertinent family history.  Social History:  has no history on file for tobacco use, alcohol use, and drug use.  Allergies: No Known Allergies  Medications:  No current facility-administered medications on file prior to encounter.   Current Outpatient Medications on File Prior to Encounter  Medication Sig Dispense Refill   acetaminophen (TYLENOL) 325 MG tablet Take 650 mg by mouth every 6 (six) hours as needed for moderate pain.     oxyCODONE-acetaminophen (PERCOCET) 7.5-325 MG tablet Take 1 tablet by mouth every 6 (six) hours as needed for severe pain.       ROS: Constitutional: No fever or chills Vision: No changes in vision ENT: No difficulty swallowing CV: No chest pain Pulm: No SOB or wheezing GI: No nausea or vomiting GU: No urgency or inability to hold urine Skin: No poor wound healing Neurologic: No numbness or tingling Psychiatric: No depression or anxiety Heme: No bruising Allergic: No reaction to medications or food   Exam: Blood pressure 113/65, pulse (!) 59,  temperature 97.9 F (36.6 C), temperature source Oral, resp. rate 18, height 5\' 3"  (1.6 m), weight 80.7 kg, SpO2 98 %. General: No acute distress Orientation: Awake alert and oriented x3 Mood and Affect: Cooperative and pleasant Gait: Unable to assess due to his fracture Coordination and balance: Within normal limits   Left lower extremity: Skin without lesions.  Splint is in place it is clean dry and intact.  Compartments are soft compressible.  He has active dorsiflexion plantarflexion of his toes.  He has sensation intact to light touch to his toes.  He has brisk cap refill less than 2 seconds.  No deformity about the knee or the thigh.  Right lower extremity skin without lesions. No tenderness to palpation. Full painless ROM, full strength in each muscle groups without evidence of instability.   Medical Decision Making: Data: Imaging: X-rays show a spiral oblique distal tibial shaft fracture.  Does not appear to have any intra-articular extension.  No significant involvement of the fibula.  Labs:  Results for orders placed or performed during the hospital encounter of 04/28/22 (from the past 24 hour(s))  CBC with Differential     Status: None   Collection Time: 04/28/22  3:50 PM  Result Value Ref Range   WBC 9.7 4.0 - 10.5 K/uL   RBC 5.04 4.22 - 5.81 MIL/uL   Hemoglobin 14.6 13.0 - 17.0 g/dL   HCT 04/30/22 16.6 - 06.3 %   MCV 84.9 80.0 - 100.0 fL   MCH 29.0 26.0 - 34.0 pg   MCHC 34.1 30.0 - 36.0 g/dL  RDW 13.3 11.5 - 15.5 %   Platelets 292 150 - 400 K/uL   nRBC 0.0 0.0 - 0.2 %   Neutrophils Relative % 70 %   Neutro Abs 6.8 1.7 - 7.7 K/uL   Lymphocytes Relative 20 %   Lymphs Abs 2.0 0.7 - 4.0 K/uL   Monocytes Relative 9 %   Monocytes Absolute 0.8 0.1 - 1.0 K/uL   Eosinophils Relative 1 %   Eosinophils Absolute 0.1 0.0 - 0.5 K/uL   Basophils Relative 0 %   Basophils Absolute 0.0 0.0 - 0.1 K/uL   Immature Granulocytes 0 %   Abs Immature Granulocytes 0.03 0.00 - 0.07 K/uL   Basic metabolic panel     Status: Abnormal   Collection Time: 04/28/22  3:50 PM  Result Value Ref Range   Sodium 139 135 - 145 mmol/L   Potassium 3.4 (L) 3.5 - 5.1 mmol/L   Chloride 107 98 - 111 mmol/L   CO2 23 22 - 32 mmol/L   Glucose, Bld 116 (H) 70 - 99 mg/dL   BUN 14 6 - 20 mg/dL   Creatinine, Ser 0.60 0.61 - 1.24 mg/dL   Calcium 9.1 8.9 - 04.5 mg/dL   GFR, Estimated >99 >77 mL/min   Anion gap 9 5 - 15     Imaging or Labs ordered: None  Medical history and chart was reviewed and case discussed with medical provider.  Assessment/Plan: 36 year old male with a left tibial shaft fracture.  Due to the unstable nature of his injury I recommended proceeding with intramedullary nailing.  Risks and benefits were discussed with the patient.  Risks include but not limited to bleeding, infection, malunion, nonunion hardware failure, hardware irritation, nerve or blood vessel injury, DVT, even the possibility anesthetic complications.  He agreed to proceed with surgery and consent was obtained.  There is potential that we could discharge him home postoperatively.  Roby Lofts, MD [facility] [phone] (office) [email or web address]

## 2022-04-29 NOTE — Transfer of Care (Signed)
Immediate Anesthesia Transfer of Care Note  Patient: Ross Bennett  Procedure(s) Performed: INTRAMEDULLARY (IM) NAIL TIBIAL (Left: Leg Lower)  Patient Location: PACU  Anesthesia Type:General  Level of Consciousness: awake, alert  and oriented  Airway & Oxygen Therapy: Patient Spontanous Breathing and Patient connected to nasal cannula oxygen  Post-op Assessment: Report given to RN and Post -op Vital signs reviewed and stable  Post vital signs: Reviewed and stable  Last Vitals:  Vitals Value Taken Time  BP 144/105 04/29/22 0908  Temp    Pulse 85 04/29/22 0910  Resp 22 04/29/22 0910  SpO2 99 % 04/29/22 0910  Vitals shown include unvalidated device data.  Last Pain:  Vitals:   04/29/22 0716  TempSrc: Oral  PainSc:       Patients Stated Pain Goal: 0 ([id number])  Complications: No notable events documented.

## 2022-04-29 NOTE — Discharge Instructions (Signed)
Orthopaedic Trauma Service Discharge Instructions   General Discharge Instructions  Orthopaedic Injuries:  Left tibia fracture  WEIGHT BEARING STATUS:Weightbearing as tolerated in the boot  RANGE OF MOTION/ACTIVITY:No restrictions. Can move the knee and ankle during the day. When you are up and walking on leg, you must wear your boot.  Bone health: Take a daily multivitamin and vitamin D  Review the following resource for additional information regarding bone health  [email or web address]  Wound Care:Remove dressings on postoperative day 2 (Sunday) incisions can be left open to air. If any drainage, can cover with 4x4 gauze and an ace wrap that can be purchased from [facility]  DVT/PE prophylaxis:Aspirin 81 mg daily  Diet: as you were eating previously.  Can use over the counter stool softeners and bowel preparations, such as Miralax, to help with bowel movements.  Narcotics can be constipating.  Be sure to drink plenty of fluids  PAIN MEDICATION USE AND EXPECTATIONS  You have likely been given narcotic medications to help control your pain.  After a traumatic event that results in an fracture (broken bone) with or without surgery, it is ok to use narcotic pain medications to help control one's pain.  We understand that everyone responds to pain differently and each individual patient will be evaluated on a regular basis for the continued need for narcotic medications. Ideally, narcotic medication use should last no more than 6-8 weeks (coinciding with fracture healing).   As a patient it is your responsibility as well to monitor narcotic medication use and report the amount and frequency you use these medications when you come to your office visit.   We would also advise that if you are using narcotic medications, you should take a dose prior to therapy to maximize you participation.  IF YOU ARE ON NARCOTIC MEDICATIONS IT IS NOT PERMISSIBLE TO OPERATE A MOTOR VEHICLE  (MOTORCYCLE/CAR/TRUCK/MOPED) OR HEAVY MACHINERY DO NOT MIX NARCOTICS WITH OTHER CNS (CENTRAL NERVOUS SYSTEM) DEPRESSANTS SUCH AS ALCOHOL   POST-OPERATIVE OPIOID TAPER INSTRUCTIONS: It is important to wean off of your opioid medication as soon as possible. If you do not need pain medication after your surgery it is ok to stop day one. Opioids include: Codeine, Hydrocodone(Norco, Vicodin), Oxycodone(Percocet, oxycontin) and hydromorphone amongst others.  Long term and even short term use of opiods can cause: Increased pain response Dependence Constipation Depression Respiratory depression And more.  Withdrawal symptoms can include Flu like symptoms Nausea, vomiting And more Techniques to manage these symptoms Hydrate well Eat regular healthy meals Stay active Use relaxation techniques(deep breathing, meditating, yoga) Do Not substitute Alcohol to help with tapering If you have been on opioids for less than two weeks and do not have pain than it is ok to stop all together.  Plan to wean off of opioids This plan should start within one week post op of your fracture surgery  Maintain the same interval or time between taking each dose and first decrease the dose.  Cut the total daily intake of opioids by one tablet each day Next start to increase the time between doses. The last dose that should be eliminated is the evening dose.    STOP SMOKING OR USING NICOTINE PRODUCTS!!!!  As discussed nicotine severely impairs your body's ability to heal surgical and traumatic wounds but also impairs bone healing.  Wounds and bone heal by forming microscopic blood vessels (angiogenesis) and nicotine is a vasoconstrictor (essentially, shrinks blood vessels).  Therefore, if vasoconstriction occurs to these microscopic blood vessels they  essentially disappear and are unable to deliver necessary nutrients to the healing tissue.  This is one modifiable factor that you can do to dramatically increase your  chances of healing your injury.    (This means no smoking, no nicotine gum, patches, etc)  DO NOT USE NONSTEROIDAL ANTI-INFLAMMATORY DRUGS (NSAID'S)  Using products such as Advil (ibuprofen), Aleve (naproxen), Motrin (ibuprofen) for additional pain control during fracture healing can delay and/or prevent the healing response.  If you would like to take over the counter (OTC) medication, Tylenol (acetaminophen) is ok.  However, some narcotic medications that are given for pain control contain acetaminophen as well. Therefore, you should not exceed more than 4000 mg of tylenol in a day if you do not have liver disease.  Also note that there are may OTC medicines, such as cold medicines and allergy medicines that my contain tylenol as well.  If you have any questions about medications and/or interactions please ask your doctor/PA or your pharmacist.      ICE AND ELEVATE INJURED/OPERATIVE EXTREMITY  Using ice and elevating the injured extremity above your heart can help with swelling and pain control.  Icing in a pulsatile fashion, such as 20 minutes on and 20 minutes off, can be followed.    Do not place ice directly on skin. Make sure there is a barrier between to skin and the ice pack.    Using frozen items such as frozen peas works well as the conform nicely to the are that needs to be iced.  USE AN ACE WRAP OR TED HOSE FOR SWELLING CONTROL  In addition to icing and elevation, Ace wraps or TED hose are used to help limit and resolve swelling.  It is recommended to use Ace wraps or TED hose until you are informed to stop.    When using Ace Wraps start the wrapping distally (farthest away from the body) and wrap proximally (closer to the body)   Example: If you had surgery on your leg or thing and you do not have a splint on, start the ace wrap at the toes and work your way up to the thigh        If you had surgery on your upper extremity and do not have a splint on, start the ace wrap at your fingers  and work your way up to the upper arm  IF YOU ARE IN A SPLINT OR CAST DO NOT REMOVE IT FOR ANY REASON   If your splint gets wet for any reason please contact the office immediately. You may shower in your splint or cast as long as you keep it dry.  This can be done by wrapping in a cast cover or garbage back (or similar)  Do Not stick any thing down your splint or cast such as pencils, money, or hangers to try and scratch yourself with.  If you feel itchy take benadryl as prescribed on the bottle for itching  IF YOU ARE IN A CAM BOOT (BLACK BOOT)  You may remove boot periodically. Perform daily dressing changes as noted below.  Wash the liner of the boot regularly and wear a sock when wearing the boot. It is recommended that you sleep in the boot until told otherwise    Call office for the following: Temperature greater than 101F Persistent nausea and vomiting Severe uncontrolled pain Redness, tenderness, or signs of infection (pain, swelling, redness, odor or green/yellow discharge around the site) Difficulty breathing, headache or visual disturbances Hives Persistent  dizziness or light-headedness Extreme fatigue Any other questions or concerns you may have after discharge  In an emergency, call 911 or go to an Emergency Department at a nearby hospital  HELPFUL INFORMATION  If you had a block, it will wear off between 8-24 hrs postop typically.  This is period when your pain may go from nearly zero to the pain you would have had postop without the block.  This is an abrupt transition but nothing dangerous is happening.  You may take an extra dose of narcotic when this happens.  You should wean off your narcotic medicines as soon as you are able.  Most patients will be off or using minimal narcotics before their first postop appointment.   We suggest you use the pain medication the first night prior to going to bed, in order to ease any pain when the anesthesia wears off. You should  avoid taking pain medications on an empty stomach as it will make you nauseous.  Do not drink alcoholic beverages or take illicit drugs when taking pain medications.  In most states it is against the law to drive while you are in a splint or sling.  And certainly against the law to drive while taking narcotics.  You may return to work/school in the next couple of days when you feel up to it.   Pain medication may make you constipated.  Below are a few solutions to try in this order: Decrease the amount of pain medication if you aren't having pain. Drink lots of decaffeinated fluids. Drink prune juice and/or each dried prunes  If the first 3 don't work start with additional solutions Take Colace - an over-the-counter stool softener Take Senokot - an over-the-counter laxative Take Miralax - a stronger over-the-counter laxative     CALL THE OFFICE WITH ANY QUESTIONS OR CONCERNS: [phone]   VISIT OUR WEBSITE FOR ADDITIONAL INFORMATION: [email or web address]    Discharge Wound Care Instructions  Do NOT apply any ointments, solutions or lotions to pin sites or surgical wounds.  These prevent needed drainage and even though solutions like hydrogen peroxide kill bacteria, they also damage cells lining the pin sites that help fight infection.  Applying lotions or ointments can keep the wounds moist and can cause them to breakdown and open up as well. This can increase the risk for infection. When in doubt call the office.  Surgical incisions should be dressed daily.  If any drainage is noted, use one layer of adaptic or Mepitel, then gauze, Kerlix, and an ace wrap.  NetCamper.czhttps://www.amazon.com/Johnson-Systagenix-ADAPTIC-Non-Adhering-Dressing/dp/B000ODY90A https://dennis-[email or web address]/https://www.amazon.com/Mepitel-[email or web address]-4x7-Each/dp/B01LMO5C6O/ref=pd_lpo_3?pd_rd_i=B01LMO5C6O&th=1  http://[email or web address]/https://www.amazon.com/Silicone-Dressing-Border-Adhesive-Waterproof/dp/B07MNV4CQJ  These dressing supplies should be available at  local medical supply stores ([facility], [facility], etc). They are not usually carried at places like [facility], [facility], [facility], etc  Once the incision is completely dry and without drainage, it may be left open to air out.  Showering may begin 36-48 hours later.  Cleaning gently with soap and water.  Traumatic wounds should be dressed daily as well.    One layer of adaptic, gauze, Kerlix, then ace wrap.  The adaptic can be discontinued once the draining has ceased    If you have a wet to dry dressing: wet the gauze with saline the squeeze as much saline out so the gauze is moist (not soaking wet), place moistened gauze over wound, then place a dry gauze over the moist one, followed by Kerlix wrap, then ace wrap.

## 2022-04-29 NOTE — ED Notes (Signed)
Patient transported to short stay #34

## 2022-04-30 LAB — HIV ANTIBODY (ROUTINE TESTING W REFLEX): HIV Screen 4th Generation wRfx: NONREACTIVE

## 2022-04-30 NOTE — Discharge Summary (Signed)
PATIENT ID: Ross Bennett        MRN:  [id number]          DOB/AGE: 1986/01/08 / 36 y.o.    DISCHARGE SUMMARY  ADMISSION DATE:    04/28/2022 DISCHARGE DATE:   04/30/2022   ADMISSION DIAGNOSIS: Fracture of tibial shaft, left, closed [S82.202A] Closed displaced oblique fracture of shaft of left fibula with delayed healing, subsequent encounter [S82.432G] Closed displaced oblique fracture of shaft of left tibia with delayed healing, subsequent encounter [S82.232G]    DISCHARGE DIAGNOSIS:  Left Tibia fracture    ADDITIONAL DIAGNOSIS: Principal Problem:   Fracture of tibial shaft, left, closed  History reviewed. No pertinent past medical history.  PROCEDURE: Procedure(s): INTRAMEDULLARY (IM) NAIL TIBIAL Left on 04/29/2022  CONSULTS:  Treatment Team:  Shona Needles, MD   HISTORY:  See H&P in chart  HOSPITAL COURSE:  Ross Bennett is a 36 y.o. admitted on 04/28/2022 and found to have a diagnosis of Left Tibia fracture.  After appropriate laboratory studies were obtained  they were taken to the operating room on 04/29/2022 and underwent  Procedure(s): INTRAMEDULLARY (IM) NAIL TIBIAL  Left.   They were given perioperative antibiotics:  Anti-infectives (From admission, onward)    Start     Dose/Rate Route Frequency Ordered Stop   04/30/22 0600  ceFAZolin (ANCEF) IVPB 2g/100 mL premix        2 g 200 mL/hr over 30 Minutes Intravenous On call to O.R. 04/29/22 1257 04/30/22 0610   04/29/22 1400  ceFAZolin (ANCEF) IVPB 2g/100 mL premix        2 g 200 mL/hr over 30 Minutes Intravenous Every 8 hours 04/29/22 1045 04/30/22 0611   04/29/22 1056  ceFAZolin (ANCEF) 2-4 GM/100ML-% IVPB       Note to Pharmacy: Lawanda Cousins R: cabinet override      04/29/22 1056 04/29/22 2314   04/29/22 0832  vancomycin (VANCOCIN) powder  Status:  Discontinued          As needed 04/29/22 [id number] 04/29/22 0901     .  Tolerated the procedure well.    POD #1, allowed out of bed WBAT.  IV saline  locked.    The remainder of the hospital course was dedicated to ambulation and strengthening.   The patient was discharged on 1 Day Post-Op in  Good condition.  Blood products given:none  DIAGNOSTIC STUDIES: Recent vital signs: Patient Vitals for the past 24 hrs:  BP Temp Temp src Pulse Resp SpO2  04/30/22 0818 118/74 98.4 F (36.9 C) Oral (!) 59 17 99 %  04/30/22 0530 125/69 98 F (36.7 C) -- 68 14 100 %  04/29/22 2019 137/74 98 F (36.7 C) -- 70 16 98 %  04/29/22 1258 133/82 98.3 F (36.8 C) Oral 69 16 95 %  04/29/22 1236 133/78 98.1 F (36.7 C) -- 84 17 100 %  04/29/22 1137 (!) 122/59 -- -- 70 16 99 %  04/29/22 1107 (!) 120/59 -- -- 66 (!) 25 99 %       Recent laboratory studies: Recent Labs    04/28/22 1550  WBC 9.7  HGB 14.6  HCT 42.8  PLT 292   Recent Labs    04/28/22 1550  NA 139  K 3.4*  CL 107  CO2 23  BUN 14  CREATININE 0.88  GLUCOSE 116*  CALCIUM 9.1   No results found for: INR, PROTIME   Recent Radiographic Studies :  DG Tibia/Fibula Left  Result Date:  04/29/2022 CLINICAL DATA:  Intramedullary nail placement for displaced tibial fracture. EXAM: LEFT TIBIA AND FIBULA - 2 VIEW COMPARISON:  None Available. FINDINGS: Intraoperative fluoroscopic images for intramedullary nail placement for tibial fracture. Total fluoroscopic time 1 minute 1 second. Total fluoroscopic dose was 1.94 mGy. Postsurgical image demonstrate tibia in near anatomical alignment. IMPRESSION: Intraoperative utilization of fluoroscopy for intramedullary nail placement. Electronically Signed   By: Keane Police D.O.   On: 04/29/2022 09:35   DG Tibia/Fibula Left  Result Date: 04/28/2022 CLINICAL DATA:  Left lower leg pain. Positive fracture. Injured left lower leg playing soccer several days ago. Went to ER in [place]. Now increased pain. Patient in splint. EXAM: LEFT TIBIA AND FIBULA - 2 VIEW; LEFT ANKLE COMPLETE - 3+ VIEW; LEFT FOOT - COMPLETE 3+ VIEW COMPARISON:  None Available.  FINDINGS: Left tibia and fibula: There is overlying cast material. There is a predominantly oblique superolateral to inferior medial mildly comminuted fracture of the distal tibial diaphysis. There is mild lateral displacement of the distal fracture component, with up to approximately 9 mm cortical step-off at the superolateral aspect of the fracture and 3 mm cortical step-off at the inferior medial aspect of the fracture on frontal view. There is an oblique, mildly comminuted fracture of the proximal fibular diaphysis with up to 5 mm transverse dimension diastasis at the mid aspect and minimal 2 mm superolateral and 3 mm posteroinferior cortical step-off. Left ankle: The ankle mortise is symmetric and intact. Overlying cast material limits evaluation of fine bony detail. No additional acute fracture is seen. Joint spaces are preserved. Left foot: Joint spaces are preserved.  No acute fracture or dislocation. IMPRESSION: 1. Oblique, mildly displaced distal tibial diaphyseal acute fracture. 2. Oblique, minimally displaced proximal fibular diaphyseal fracture. Electronically Signed   By: Yvonne Kendall M.D.   On: 04/28/2022 16:29   DG Ankle Complete Left  Result Date: 04/28/2022 CLINICAL DATA:  Left lower leg pain. Positive fracture. Injured left lower leg playing soccer several days ago. Went to ER in [place]. Now increased pain. Patient in splint. EXAM: LEFT TIBIA AND FIBULA - 2 VIEW; LEFT ANKLE COMPLETE - 3+ VIEW; LEFT FOOT - COMPLETE 3+ VIEW COMPARISON:  None Available. FINDINGS: Left tibia and fibula: There is overlying cast material. There is a predominantly oblique superolateral to inferior medial mildly comminuted fracture of the distal tibial diaphysis. There is mild lateral displacement of the distal fracture component, with up to approximately 9 mm cortical step-off at the superolateral aspect of the fracture and 3 mm cortical step-off at the inferior medial aspect of the fracture on frontal view.  There is an oblique, mildly comminuted fracture of the proximal fibular diaphysis with up to 5 mm transverse dimension diastasis at the mid aspect and minimal 2 mm superolateral and 3 mm posteroinferior cortical step-off. Left ankle: The ankle mortise is symmetric and intact. Overlying cast material limits evaluation of fine bony detail. No additional acute fracture is seen. Joint spaces are preserved. Left foot: Joint spaces are preserved.  No acute fracture or dislocation. IMPRESSION: 1. Oblique, mildly displaced distal tibial diaphyseal acute fracture. 2. Oblique, minimally displaced proximal fibular diaphyseal fracture. Electronically Signed   By: Yvonne Kendall M.D.   On: 04/28/2022 16:29   DG Tibia/Fibula Left Port  Result Date: 04/29/2022 CLINICAL DATA:  Postop tibia/fibula EXAM: PORTABLE LEFT TIBIA AND FIBULA - 2 VIEW COMPARISON:  Radiographs dated Apr 28, 2022 FINDINGS: Status post intramedullary nail placement for displaced mildly distal tibial diaphyseal fracture now  in near anatomical alignment. Mildly displaced fracture of the proximal fibular diaphysis is unchanged. IMPRESSION: As above. Electronically Signed   By: Keane Police D.O.   On: 04/29/2022 09:37   DG Foot Complete Left  Result Date: 04/28/2022 CLINICAL DATA:  Left lower leg pain. Positive fracture. Injured left lower leg playing soccer several days ago. Went to ER in [place]. Now increased pain. Patient in splint. EXAM: LEFT TIBIA AND FIBULA - 2 VIEW; LEFT ANKLE COMPLETE - 3+ VIEW; LEFT FOOT - COMPLETE 3+ VIEW COMPARISON:  None Available. FINDINGS: Left tibia and fibula: There is overlying cast material. There is a predominantly oblique superolateral to inferior medial mildly comminuted fracture of the distal tibial diaphysis. There is mild lateral displacement of the distal fracture component, with up to approximately 9 mm cortical step-off at the superolateral aspect of the fracture and 3 mm cortical step-off at the inferior medial  aspect of the fracture on frontal view. There is an oblique, mildly comminuted fracture of the proximal fibular diaphysis with up to 5 mm transverse dimension diastasis at the mid aspect and minimal 2 mm superolateral and 3 mm posteroinferior cortical step-off. Left ankle: The ankle mortise is symmetric and intact. Overlying cast material limits evaluation of fine bony detail. No additional acute fracture is seen. Joint spaces are preserved. Left foot: Joint spaces are preserved.  No acute fracture or dislocation. IMPRESSION: 1. Oblique, mildly displaced distal tibial diaphyseal acute fracture. 2. Oblique, minimally displaced proximal fibular diaphyseal fracture. Electronically Signed   By: Yvonne Kendall M.D.   On: 04/28/2022 16:29   DG C-Arm 1-60 Min-No Report  Result Date: 04/29/2022 Fluoroscopy was utilized by the requesting physician.  No radiographic interpretation.    DISCHARGE INSTRUCTIONS: Discharge Instructions     Discharge patient   Complete by: As directed    Discharge disposition: 01-Home or Self Care   Discharge patient date: 04/29/2022       DISCHARGE MEDICATIONS:   Allergies as of 04/30/2022   No Known Allergies      Medication List     STOP taking these medications    acetaminophen 325 MG tablet Commonly known as: TYLENOL   oxyCODONE-acetaminophen 7.5-325 MG tablet Commonly known as: PERCOCET Replaced by: oxyCODONE-acetaminophen 5-325 MG tablet       TAKE these medications    aspirin EC 81 MG tablet Take 1 tablet (81 mg total) by mouth daily. Swallow whole.   methocarbamol 500 MG tablet Commonly known as: ROBAXIN Take 1 tablet (500 mg total) by mouth every 6 (six) hours as needed for up to 7 days for muscle spasms.   oxyCODONE-acetaminophen 5-325 MG tablet Commonly known as: Percocet Take 1 tablet by mouth every 4 (four) hours as needed for up to 7 days for severe pain. Replaces: oxyCODONE-acetaminophen 7.5-325 MG tablet        FOLLOW UP VISIT:     Follow-up Information     Haddix, Thomasene Lot, MD Follow up in 2 week(s).   Specialty: Orthopedic Surgery Contact information: [place] [address] [phone]                 DISPOSITION:   Home  CONDITION:  Veto Kemps, PA-C  04/30/2022 10:41 AM

## 2022-04-30 NOTE — Progress Notes (Signed)
Discharge summary packet provided to pt/spouse with instructions. Pt/spouse verbalized understanding of instrucitons. Wound care management has been discussed with no complaints. Pt d/c to home as ordered. Wife is responsible for pt's ride.

## 2022-04-30 NOTE — Plan of Care (Signed)
  Problem: Activity: Goal: Risk for activity intolerance will decrease Outcome: Progressing   Problem: Nutrition: Goal: Adequate nutrition will be maintained Outcome: Progressing   Problem: Coping: Goal: Level of anxiety will decrease Outcome: Progressing   Problem: Elimination: Goal: Will not experience complications related to bowel motility Outcome: Progressing   

## 2022-05-03 ENCOUNTER — Encounter (HOSPITAL_COMMUNITY): Payer: Self-pay | Admitting: Student

## 2022-05-04 ENCOUNTER — Encounter (HOSPITAL_COMMUNITY): Payer: Self-pay | Admitting: Student

## 2024-01-14 IMAGING — DX DG TIBIA/FIBULA PORT 2V*L*
2 series · 2 of 2 positions shown · non-contrast
Comparison: Radiographs dated April 28, 2022

CLINICAL DATA: Postop tibia/fibula

EXAM:
PORTABLE LEFT TIBIA AND FIBULA - 2 VIEW

[tibia ap]
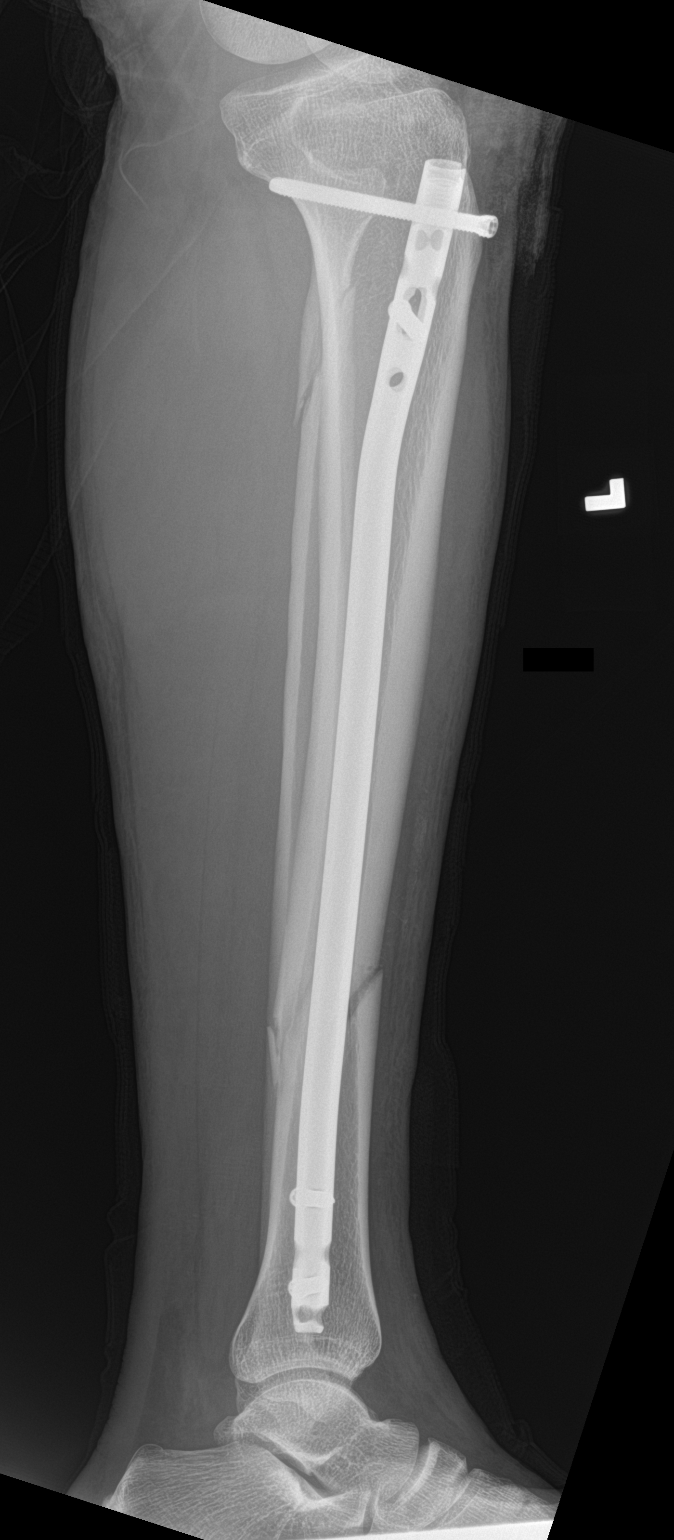

[tibia lat]
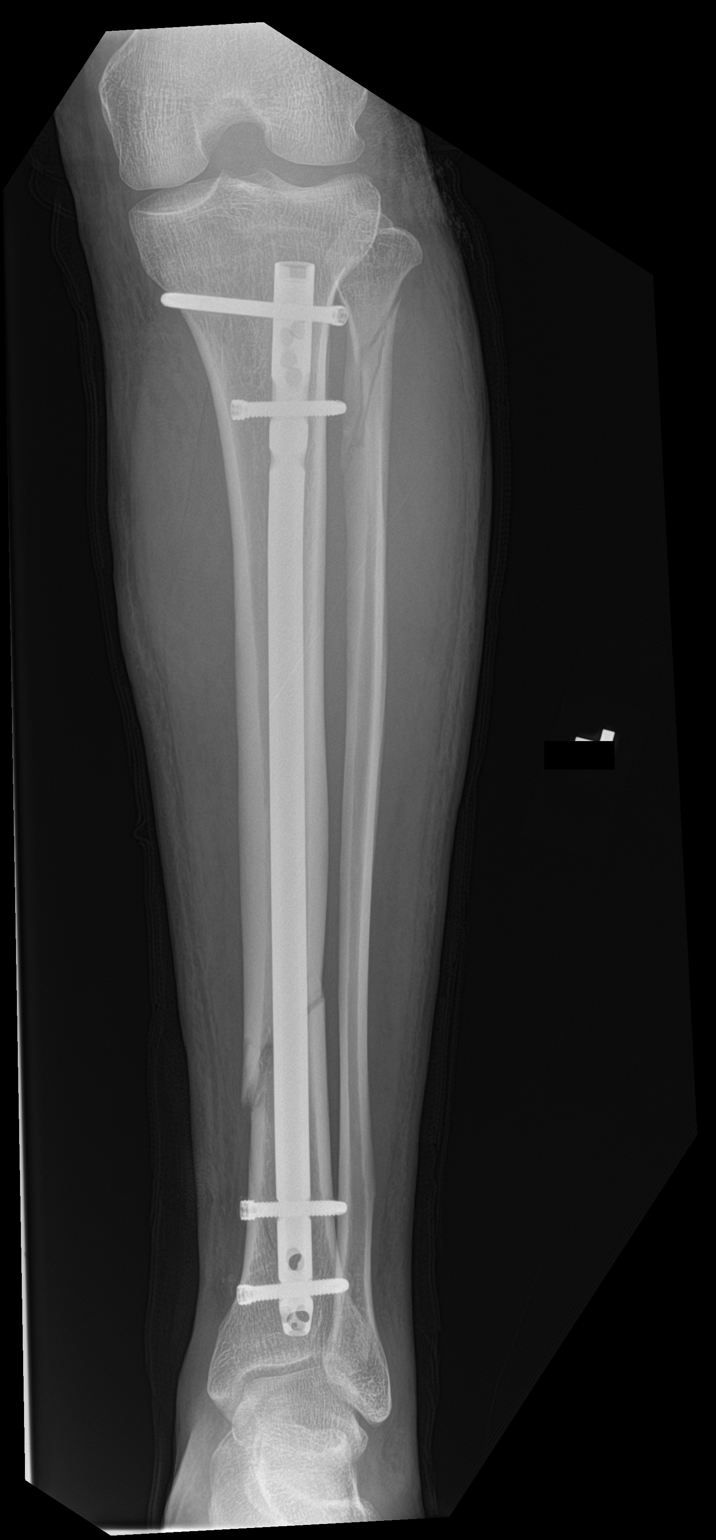

[2 of 2 positions shown; findings below may reference images not displayed]

FINDINGS: Status post intramedullary nail placement for displaced mildly
distal tibial diaphyseal fracture now in near anatomical alignment.
Mildly displaced fracture of the proximal fibular diaphysis is
unchanged.
IMPRESSION: As above.
